# Patient Record
Sex: Male | Born: 1995 | Race: Black or African American | Hispanic: No | Marital: Single
Health system: Southern US, Community
[De-identification: ages and names within clinical notes are randomized; demographics above are authoritative.]

---

## 2020-04-18 ENCOUNTER — Other Ambulatory Visit: Payer: Self-pay

## 2020-04-18 DIAGNOSIS — Z5321 Procedure and treatment not carried out due to patient leaving prior to being seen by health care provider: Secondary | ICD-10-CM | POA: Insufficient documentation

## 2020-04-18 DIAGNOSIS — R109 Unspecified abdominal pain: Secondary | ICD-10-CM | POA: Insufficient documentation

## 2020-04-18 LAB — URINALYSIS, COMPLETE (UACMP) WITH MICROSCOPIC
Bacteria, UA: NONE SEEN
Bilirubin Urine: NEGATIVE
Glucose, UA: NEGATIVE mg/dL
Hgb urine dipstick: NEGATIVE
Ketones, ur: NEGATIVE mg/dL
Leukocytes,Ua: NEGATIVE
Nitrite: NEGATIVE
Protein, ur: NEGATIVE mg/dL
Specific Gravity, Urine: 1.02 (ref 1.005–1.030)
Squamous Epithelial / HPF: NONE SEEN (ref 0–5)
pH: 7 (ref 5.0–8.0)

## 2020-04-18 LAB — CBC
HCT: 42.1 % (ref 39.0–52.0)
Hemoglobin: 14.3 g/dL (ref 13.0–17.0)
MCH: 30.4 pg (ref 26.0–34.0)
MCHC: 34 g/dL (ref 30.0–36.0)
MCV: 89.6 fL (ref 80.0–100.0)
Platelets: 232 10*3/uL (ref 150–400)
RBC: 4.7 MIL/uL (ref 4.22–5.81)
RDW: 13.5 % (ref 11.5–15.5)
WBC: 7.2 10*3/uL (ref 4.0–10.5)
nRBC: 0 % (ref 0.0–0.2)

## 2020-04-18 LAB — COMPREHENSIVE METABOLIC PANEL
ALT: 13 U/L (ref 0–44)
AST: 19 U/L (ref 15–41)
Albumin: 4.5 g/dL (ref 3.5–5.0)
Alkaline Phosphatase: 57 U/L (ref 38–126)
Anion gap: 6 (ref 5–15)
BUN: 17 mg/dL (ref 6–20)
CO2: 31 mmol/L (ref 22–32)
Calcium: 9.4 mg/dL (ref 8.9–10.3)
Chloride: 99 mmol/L (ref 98–111)
Creatinine, Ser: 1.24 mg/dL (ref 0.61–1.24)
GFR calc Af Amer: >60 mL/min (ref >60)
GFR calc non Af Amer: >60 mL/min (ref >60)
Glucose, Bld: 99 mg/dL (ref 70–99)
Potassium: 4.1 mmol/L (ref 3.5–5.1)
Sodium: 136 mmol/L (ref 135–145)
Total Bilirubin: 0.7 mg/dL (ref 0.3–1.2)
Total Protein: 7.6 g/dL (ref 6.5–8.1)

## 2020-04-18 LAB — LIPASE, BLOOD: Lipase: 30 U/L (ref 11–51)

## 2020-04-18 NOTE — ED Triage Notes (Signed)
Pt states left sided abd pain with fatigue and "off and on diarrhea" for over a week. Pt appears in no acute distress, denies fever, nausea, vomiting.

## 2020-04-19 ENCOUNTER — Emergency Department
Admission: EM | Admit: 2020-04-19 | Discharge: 2020-04-19 | Disposition: A | Discharge status: Home / Self Care | Payer: Self-pay | Attending: Emergency Medicine | Admitting: Emergency Medicine

## 2020-04-19 NOTE — ED Notes (Signed)
No answer when called 

## 2020-05-07 ENCOUNTER — Encounter: Payer: Self-pay | Admitting: Emergency Medicine

## 2020-05-07 ENCOUNTER — Emergency Department
Admission: EM | Admit: 2020-05-07 | Discharge: 2020-05-07 | Disposition: A | Discharge status: Home / Self Care | Payer: BC Managed Care – PPO | Attending: Emergency Medicine | Admitting: Emergency Medicine

## 2020-05-07 ENCOUNTER — Other Ambulatory Visit: Payer: Self-pay

## 2020-05-07 DIAGNOSIS — R1012 Left upper quadrant pain: Secondary | ICD-10-CM | POA: Diagnosis not present

## 2020-05-07 LAB — CBC
HCT: 42.6 % (ref 39.0–52.0)
Hemoglobin: 14.6 g/dL (ref 13.0–17.0)
MCH: 30.3 pg (ref 26.0–34.0)
MCHC: 34.3 g/dL (ref 30.0–36.0)
MCV: 88.4 fL (ref 80.0–100.0)
Platelets: 234 10*3/uL (ref 150–400)
RBC: 4.82 MIL/uL (ref 4.22–5.81)
RDW: 13.5 % (ref 11.5–15.5)
WBC: 5.1 10*3/uL (ref 4.0–10.5)
nRBC: 0 % (ref 0.0–0.2)

## 2020-05-07 LAB — URINALYSIS, COMPLETE (UACMP) WITH MICROSCOPIC
Bacteria, UA: NONE SEEN
Bilirubin Urine: NEGATIVE
Glucose, UA: NEGATIVE mg/dL
Hgb urine dipstick: NEGATIVE
Ketones, ur: NEGATIVE mg/dL
Leukocytes,Ua: NEGATIVE
Nitrite: NEGATIVE
Protein, ur: NEGATIVE mg/dL
Specific Gravity, Urine: 1.016 (ref 1.005–1.030)
Squamous Epithelial / HPF: NONE SEEN (ref 0–5)
pH: 7 (ref 5.0–8.0)

## 2020-05-07 LAB — COMPREHENSIVE METABOLIC PANEL
ALT: 16 U/L (ref 0–44)
AST: 23 U/L (ref 15–41)
Albumin: 4.6 g/dL (ref 3.5–5.0)
Alkaline Phosphatase: 52 U/L (ref 38–126)
Anion gap: 8 (ref 5–15)
BUN: 13 mg/dL (ref 6–20)
CO2: 29 mmol/L (ref 22–32)
Calcium: 9.2 mg/dL (ref 8.9–10.3)
Chloride: 101 mmol/L (ref 98–111)
Creatinine, Ser: 1.11 mg/dL (ref 0.61–1.24)
GFR calc Af Amer: >60 mL/min (ref >60)
GFR calc non Af Amer: >60 mL/min (ref >60)
Glucose, Bld: 86 mg/dL (ref 70–99)
Potassium: 4.4 mmol/L (ref 3.5–5.1)
Sodium: 138 mmol/L (ref 135–145)
Total Bilirubin: 1.1 mg/dL (ref 0.3–1.2)
Total Protein: 7.6 g/dL (ref 6.5–8.1)

## 2020-05-07 LAB — LIPASE, BLOOD: Lipase: 25 U/L (ref 11–51)

## 2020-05-07 MED ORDER — OMEPRAZOLE 40 MG PO CPDR: 40.0000 mg | DELAYED_RELEASE_CAPSULE | Freq: Every day | ORAL | 0 refills | Status: AC

## 2020-05-07 NOTE — ED Provider Notes (Signed)
[facility] Emergency Department Provider Note ____________________________________________   First MD Initiated Contact with Patient 05/07/20 1338     (approximate)  I have reviewed the triage vital signs and the nursing notes.   HISTORY  Chief Complaint Abdominal Pain    HPI Edwin Lopez is a 24 y.o. male with no significant past medical history who presents with abdominal pain over approximately last month, intermittent course, usually lasting for a few hours and not occurring every day.  It is in the left upper abdomen.  He states it is sometimes worse when he wakes up, and also in the late afternoon.  He has occasional nausea with it but denies vomiting or diarrhea, fever or chills, or any urinary symptoms.  He reports occasional mild constipation (he may have a single day at a time with no bowel movement) but denies severe constipation or straining to have a bowel movement.  He was seen at urgent care a few weeks ago, started on stool softeners which she states helped briefly.  History reviewed. No pertinent past medical history.  There are no problems to display for this patient.   History reviewed. No pertinent surgical history.  Prior to Admission medications   Medication Sig Start Date End Date Taking? Authorizing Provider  omeprazole (PRILOSEC) 40 MG capsule Take 1 capsule (40 mg total) by mouth daily. 05/07/20 06/06/20  Arta Silence, MD    Allergies Patient has no known allergies.  No family history on file.  Social History Social History   Tobacco Use  . Smoking status: Not on file  Substance Use Topics  . Alcohol use: Not on file  . Drug use: Not on file    Review of Systems  Constitutional: No fever/chills. Eyes: No redness. ENT: No sore throat. Cardiovascular: Denies chest pain. Respiratory: Denies shortness of breath. Gastrointestinal: Positive for nausea.  No vomiting or diarrhea. Genitourinary: Negative for  dysuria.  Musculoskeletal: Negative for back pain. Skin: Negative for rash. Neurological: Negative for headache.   ____________________________________________   PHYSICAL EXAM:  VITAL SIGNS: ED Triage Vitals [05/07/20 1102]  Enc Vitals Group     BP (!) 142/75     Pulse Rate 63     Resp 16     Temp 99 F (37.2 C)     Temp Source Oral     SpO2 100 %     Weight 169 lb 12.1 oz (77 kg)     Height 5\' 11"  (1.803 m)     Head Circumference      Peak Flow      Pain Score 1     Pain Loc      Pain Edu?      Excl. in [place]?     Constitutional: Alert and oriented. Well appearing and in no acute distress. Eyes: Conjunctivae are normal.  No scleral icterus. Head: Atraumatic. Nose: No congestion/rhinnorhea. Mouth/Throat: Mucous membranes are moist.   Neck: Normal range of motion.  Cardiovascular: Normal rate, regular rhythm.  Good peripheral circulation. Respiratory: Normal respiratory effort.  No retractions.  Gastrointestinal: Soft and nontender. No distention.  Genitourinary: No flank tenderness. Musculoskeletal: Extremities warm and well perfused.  Neurologic:  Normal speech and language. No gross focal neurologic deficits are appreciated.  Skin:  Skin is warm and dry. No rash noted. Psychiatric: Mood and affect are normal. Speech and behavior are normal.  ____________________________________________   LABS (all labs ordered are listed, but only abnormal results are displayed)  Labs Reviewed  URINALYSIS, COMPLETE (UACMP) WITH MICROSCOPIC - Abnormal; Notable for the following components:      Result Value   Color, Urine YELLOW (*)    APPearance HAZY (*)    All other components within normal limits  LIPASE, BLOOD  COMPREHENSIVE METABOLIC PANEL  CBC   ____________________________________________  EKG   ____________________________________________  RADIOLOGY    ____________________________________________   PROCEDURES  Procedure(s) performed:  No  Procedures  Critical Care performed: No ____________________________________________   INITIAL IMPRESSION / ASSESSMENT AND PLAN / ED COURSE  Pertinent labs & imaging results that were available during my care of the patient were reviewed by me and considered in my medical decision making (see chart for details).  24 year old male with no significant PMH presents with left upper abdominal pain over approximately last month which does not happen every day and usually last for a few hours when it is there.  He reports mild occasional constipation.  I reviewed the past medical records in Epic.  The patient was seen at urgent care in late May for this.  He had an x-ray at that time which showed significant stool burden and was started on a stool softener.  He states that this helped briefly, but then the pain returned.  On exam currently he is overall very well-appearing.  His vital signs are normal.  The abdomen is soft and nontender.  He denies any acute pain at this time.  Lab work-up was obtained from triage including LFTs and lipase, all of which is within normal limits.  Differential includes pain related to increased fecal burden, however the patient does not appear to suffer from significant constipation and states stool softeners were minimally helpful.  I suspect more likely gastritis or possible PUD given the location of the pain.  There is no evidence of colitis, diverticulitis, acute pancreatitis, or other hepatobiliary cause, and no indication for imaging at this time.  I recommend that the patient start on a PPI, and follow-up with GI in the next several weeks if he has no improvement in his symptoms.  He is comfortable with this plan.  I gave him thorough return precautions and he expressed understanding.    ____________________________________________   FINAL CLINICAL IMPRESSION(S) / ED DIAGNOSES  Final diagnoses:  Left upper quadrant abdominal pain      NEW  MEDICATIONS STARTED DURING THIS VISIT:  New Prescriptions   OMEPRAZOLE (PRILOSEC) 40 MG CAPSULE    Take 1 capsule (40 mg total) by mouth daily.     Note:  This document was prepared using Dragon voice recognition software and may include unintentional dictation errors.   Dionne Bucy, MD 05/07/20 1429

## 2020-05-07 NOTE — ED Triage Notes (Signed)
Patient reports continued abdominal pain. States he was seen here recently for same but it has not improved. Reports continued diarrhea.

## 2020-05-07 NOTE — Discharge Instructions (Signed)
We suspect that your abdominal pain may be related to gastritis or inflammation in the stomach lining.  Take the Prilosec daily for at least the next several weeks.  You should follow-up with a gastroenterologist in the next few weeks if your symptoms continue.  Return here or to the nearest ER immediately for new, worsening, or persistent severe abdominal pain, pain that becomes more constant, vomiting or diarrhea, fever or chills, or any other new or worsening symptoms that concern you.

## 2020-05-16 ENCOUNTER — Other Ambulatory Visit: Payer: Self-pay | Admitting: Student

## 2020-05-16 DIAGNOSIS — R1012 Left upper quadrant pain: Secondary | ICD-10-CM

## 2020-05-16 DIAGNOSIS — R1013 Epigastric pain: Secondary | ICD-10-CM

## 2020-05-22 ENCOUNTER — Ambulatory Visit
Admission: RE | Admit: 2020-05-22 | Discharge: 2020-05-22 | Disposition: A | Discharge status: Home / Self Care | Payer: PRIVATE HEALTH INSURANCE | Source: Ambulatory Visit | Attending: Student | Admitting: Student

## 2020-05-22 ENCOUNTER — Other Ambulatory Visit: Payer: Self-pay

## 2020-05-22 DIAGNOSIS — R1013 Epigastric pain: Secondary | ICD-10-CM | POA: Diagnosis present

## 2020-05-22 DIAGNOSIS — R1012 Left upper quadrant pain: Secondary | ICD-10-CM | POA: Insufficient documentation

## 2021-04-16 IMAGING — US US ABDOMEN COMPLETE
1 series · 14 of 25 positions shown · non-contrast
Comparison: None.

CLINICAL DATA: Epigastric pain

EXAM:
ABDOMEN ULTRASOUND COMPLETE

[Series 1: us abdomen complete · 0.15mm/px · 14 of 60 slices shown]
[im 1/60]
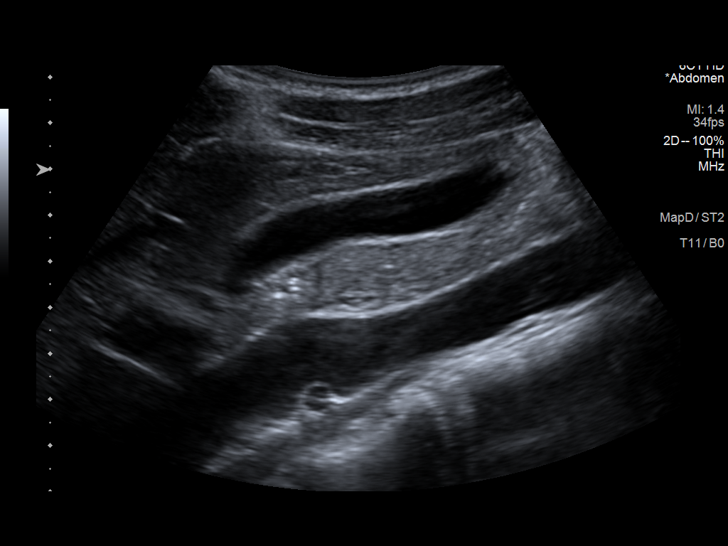
[im 5/60]
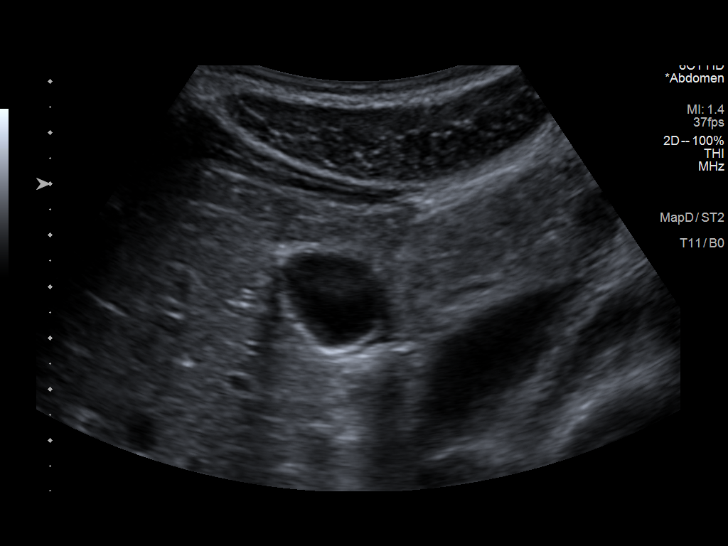
[im 10/60]
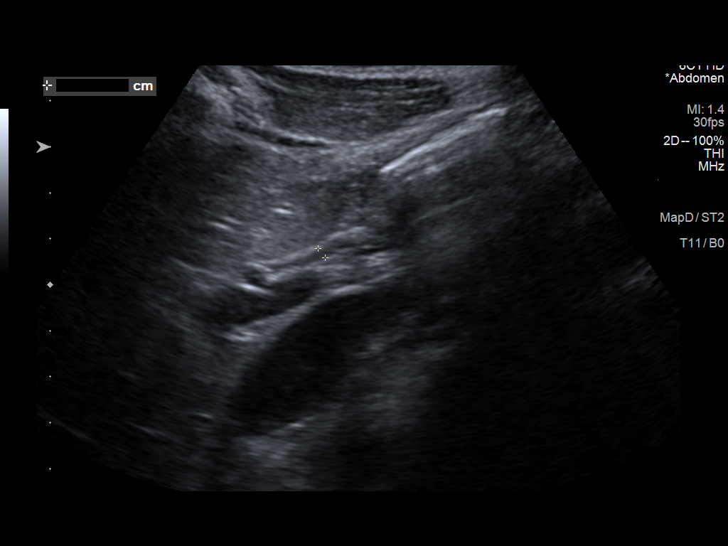
[im 15/60]
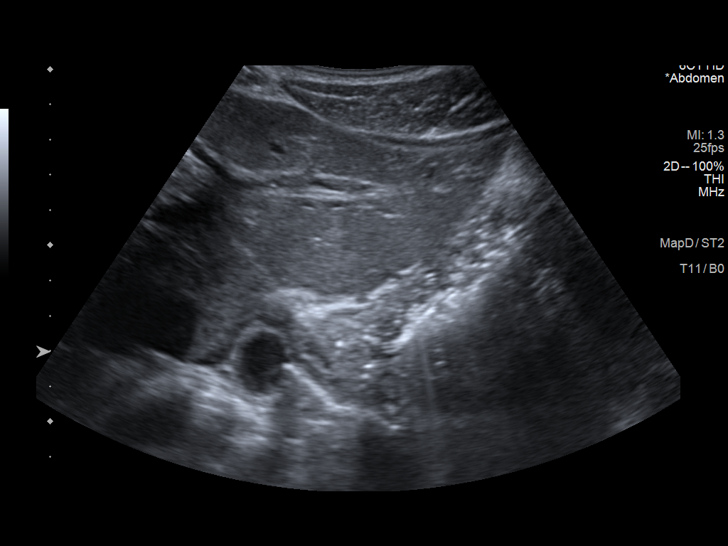
[im 20/60]
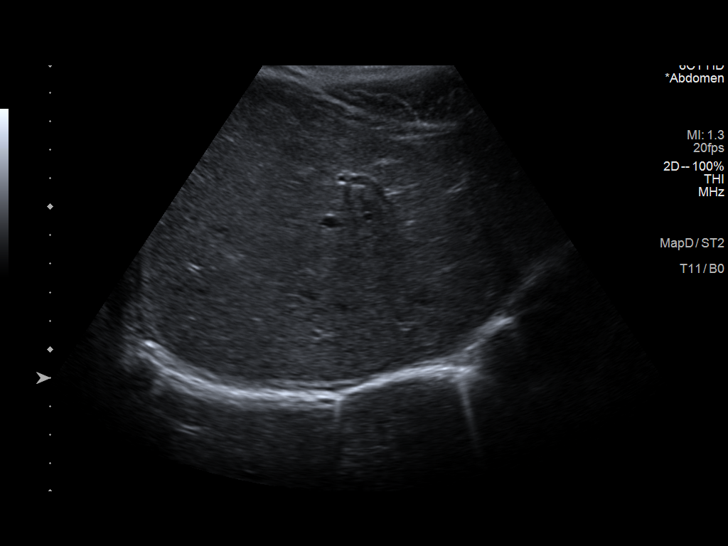
[im 23/60]
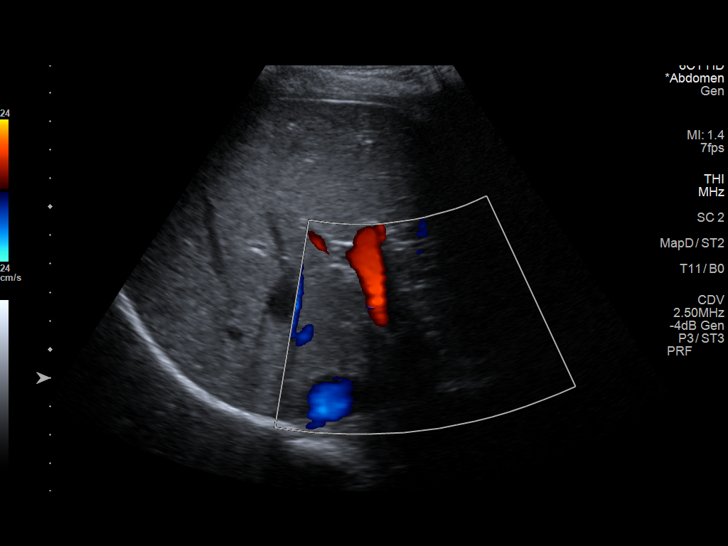
[im 28/60]
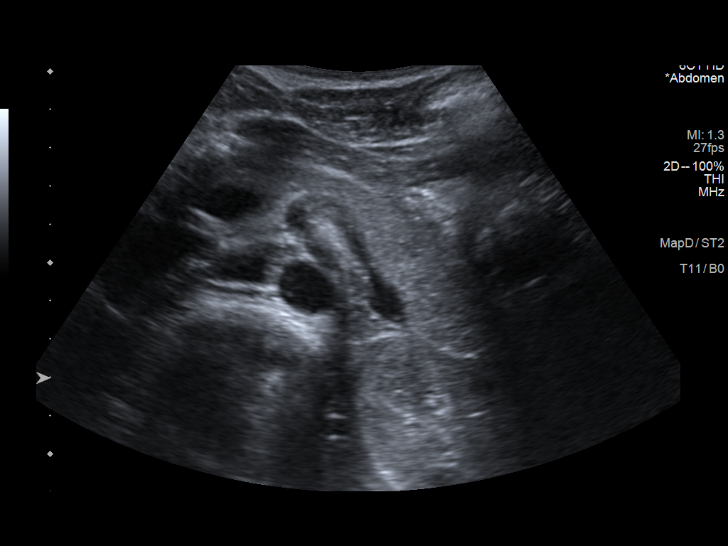
[im 32/60]
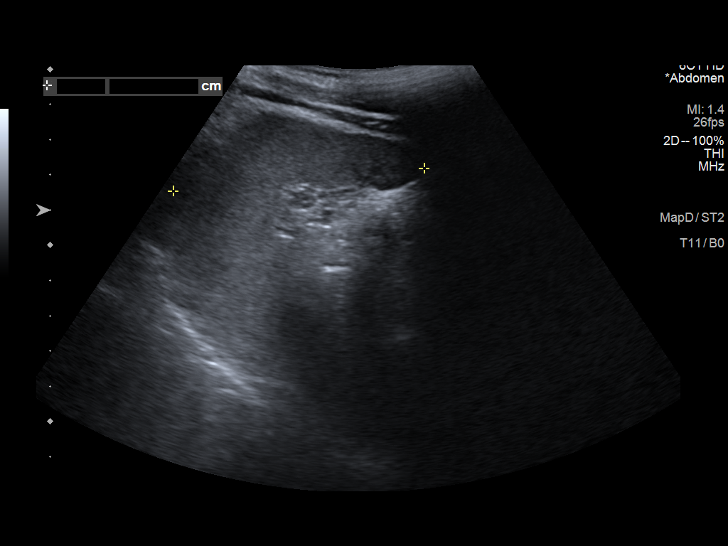
[im 37/60]
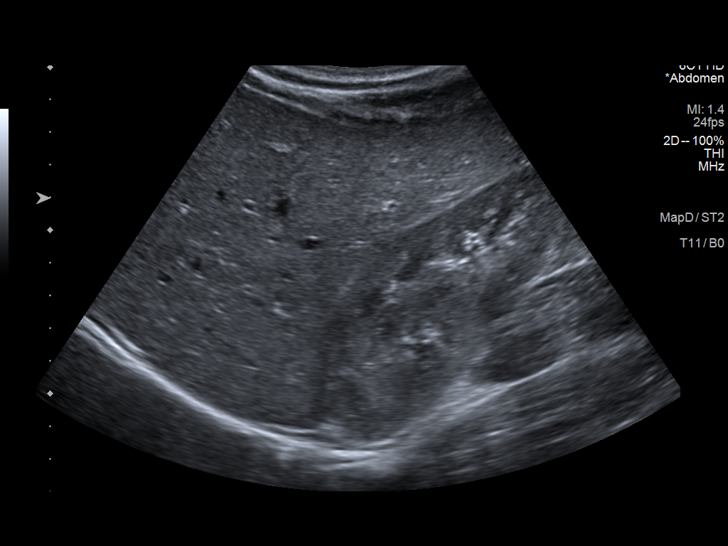
[im 40/60]
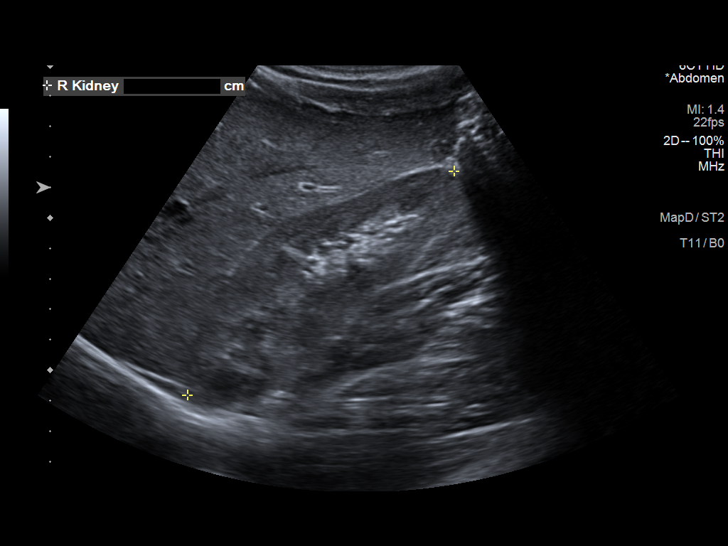
[im 45/60]
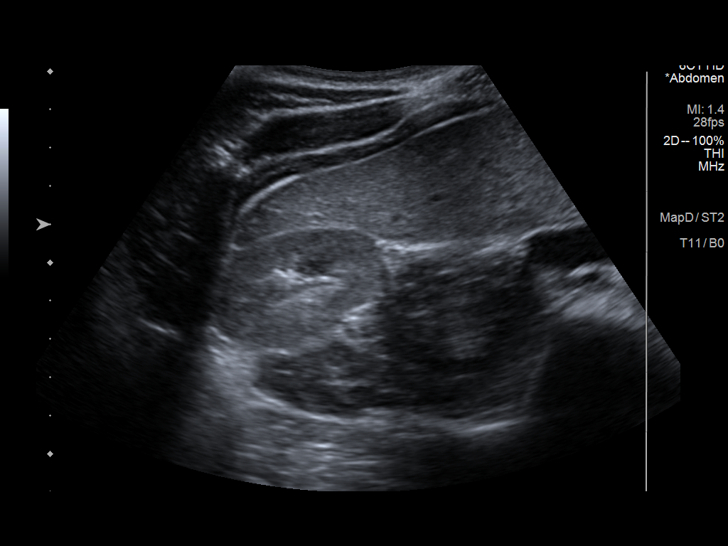
[im 50/60]
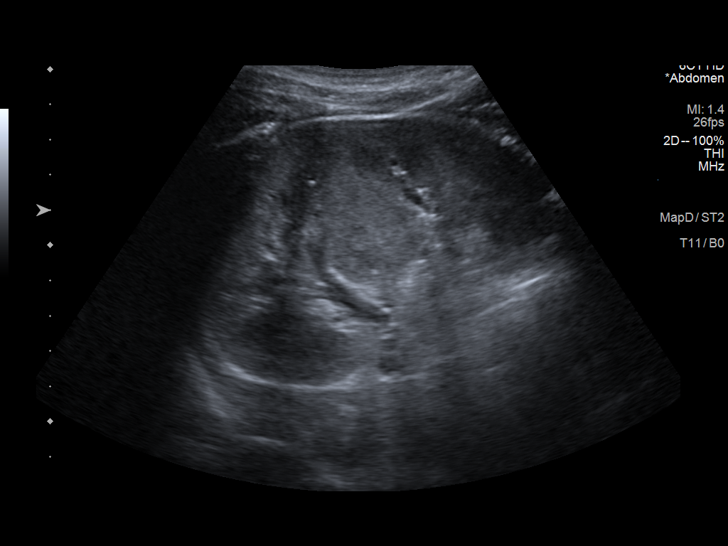
[im 55/60]
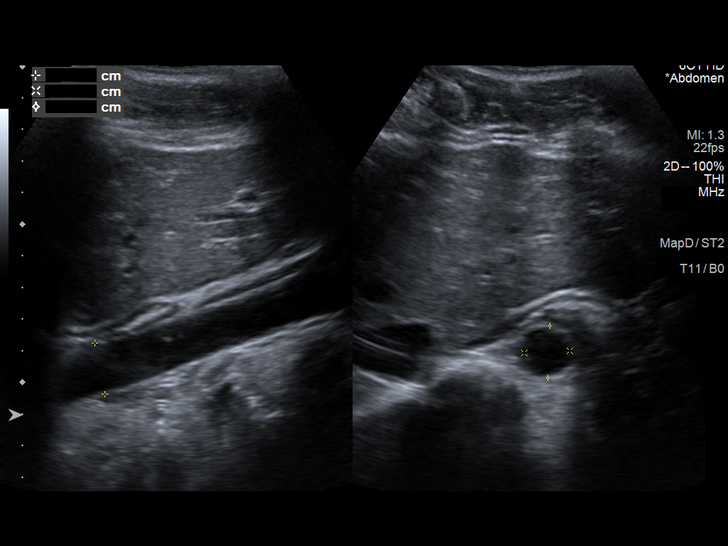
[im 60/60]
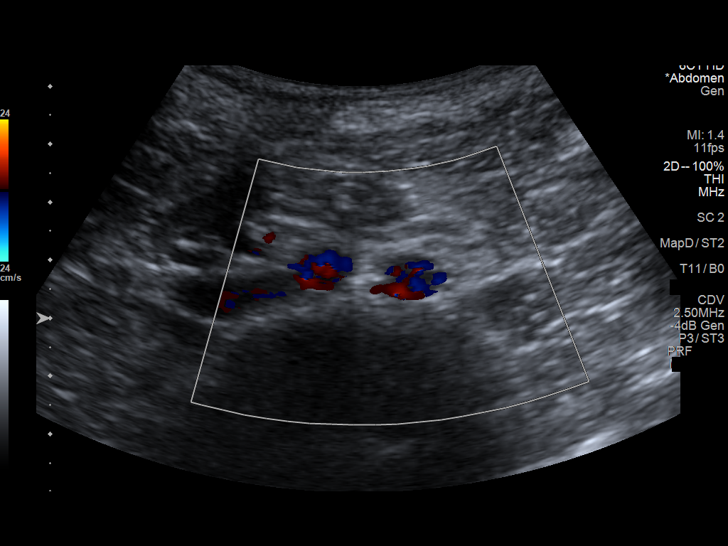

[14 of 25 positions shown; findings below may reference images not displayed]

FINDINGS: Gallbladder: No gallstones or wall thickening visualized. No
sonographic Murphy sign noted by sonographer.

Common bile duct: Diameter: 2.5 mm

Liver: No focal lesion identified. Within normal limits in
parenchymal echogenicity. Portal vein is patent on color Doppler
imaging with normal direction of blood flow towards the liver.

IVC: No abnormality visualized.

Pancreas: Visualized portion unremarkable.

Spleen: Size and appearance within normal limits.

Right Kidney: Length: 11.4 cm. Echogenicity within normal limits. No
mass or hydronephrosis visualized.

Left Kidney: Length: 11.9 cm. Echogenicity within normal limits. No
mass or hydronephrosis visualized.

Abdominal aorta: No aneurysm visualized.

Other findings: None.
IMPRESSION: Negative examination
